# Patient Record
Sex: Male | Born: 1995 | Race: White | Hispanic: No | Marital: Single | Smoking: Former smoker
Health system: Southern US, Community
[De-identification: ages and names within clinical notes are randomized; demographics above are authoritative.]

## PROBLEM LIST (undated history)

## (undated) DIAGNOSIS — R109 Unspecified abdominal pain: Secondary | ICD-10-CM

## (undated) HISTORY — DX: Unspecified abdominal pain: R10.9

---
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
## 2022-07-25 ENCOUNTER — Ambulatory Visit (INDEPENDENT_AMBULATORY_CARE_PROVIDER_SITE_OTHER): Payer: Managed Care, Other (non HMO) | Admitting: Podiatry

## 2022-07-25 ENCOUNTER — Encounter: Payer: Self-pay | Admitting: Podiatry

## 2022-07-25 DIAGNOSIS — S90821A Blister (nonthermal), right foot, initial encounter: Secondary | ICD-10-CM | POA: Diagnosis not present

## 2022-07-25 NOTE — Progress Notes (Signed)
Subjective:  Patient ID: Brett Reynolds, male    DOB: 02-Feb-1996,  MRN: [id number]  Chief Complaint  Patient presents with   Foot Problem    Right foot has a possible cyst at the bottom of the foot, ball of foot, near 5th digit. Painful when walking    27 y.o. male presents with concern for possible cyst on the bottom of the right foot.  He says that this started approximately last week Wednesday or Thursday.  He was working with his dad in the yard they were spreading some mulch.  He was wearing shoes.  Went to urgent care after a hard lump appeared in the bottom of the right foot.  He was told that it could be a ganglion cyst.  He was placed on antibiotics and given pain meds.  Also given a postop shoe to wear on the right foot.  He says that he has significant pain with any pressure on the area.  Denies a history of diabetes.  Has not noticed any drainage from the area.  Past Medical History:  Diagnosis Date   Abdominal pain     Allergies  Allergen Reactions   Triaminic Fever Reducer [Acetaminophen]     ROS: Negative except as per HPI above  Objective:  General: AAO x3, NAD  Dermatological: Attention directed the right plantar lateral forefoot approximately under the fourth metatarsal there is noted to be a large bulla present approximately 1 to 2 cm in diameter.  There is a close skin envelope initially.  Upon debridement of overlying tissue there is noted to be serous fluid that drained out consistent with serous blister.  Underlying tissue is raw and represents an open wound.  No evidence of deep infection or purulence.  No surrounding erythema.  See postdebridement picture below  Vascular:  Dorsalis Pedis artery and Posterior Tibial artery pedal pulses are 2/4 bilateral.  Capillary fill time < 3 sec to all digits.   Neruologic: Grossly intact via light touch bilateral. Protective threshold intact to all sites bilateral.   Musculoskeletal: No gross boney pedal deformities  bilateral. No pain, crepitus, or limitation noted with foot and ankle range of motion bilateral. Muscular strength 5/5 in all groups tested bilateral.  Gait: Unassisted, Nonantalgic.     Radiographs:  Deferred Assessment:   1. Blister of right foot without infection, initial encounter      Plan:  Patient was evaluated and treated and all questions answered.  # Serous blister of right plantar forefoot likely friction blister -Discussed with patient that he developed a friction blister with serous fluid that was drained in the office today. -After Betadine prep a 15 blade was used to lance and then remove the necrotic skin overlying the blister.  Patient tolerated well.  The area was drained of all serous fluid. -Area was then cleaned with Betadine and a nonadherent dressing was applied -Recommend the patient continue with Betadine and nonadherent bandage dressing for the next 1 to 2 weeks until the area is fully healed. -Continue use of postop shoe to prevent pressure on the area. -Decrease activity levels and stay off the foot is much as possible. -No indication for antibiotics given no evidence of infection at the site of the blister.  Return in about 2 weeks (around 08/08/2022).          Corinna Gab, DPM [facility] / [facility]

## 2022-08-15 ENCOUNTER — Encounter: Payer: Self-pay | Admitting: General Practice

## 2022-08-15 ENCOUNTER — Ambulatory Visit (INDEPENDENT_AMBULATORY_CARE_PROVIDER_SITE_OTHER): Payer: Managed Care, Other (non HMO) | Admitting: Podiatry

## 2022-08-15 DIAGNOSIS — S90821D Blister (nonthermal), right foot, subsequent encounter: Secondary | ICD-10-CM | POA: Diagnosis not present

## 2022-08-15 NOTE — Progress Notes (Signed)
  Subjective:  Patient ID: Brett Reynolds, male    DOB: 07/26/1995,  MRN: [id number]  Chief Complaint  Patient presents with   Follow-up    Right blister on foot, bottom. Patient denies any pain. Patient is not longer using the boot.     27 y.o. male presents for follow-up of ruptured blister on the bottom of the right foot.  He says that he is feeling very well there is no pain at the area.  He is no longer putting anything on the prior blister site.  Has healed up very nicely.  He was previously wearing a postop shoe but has stopped using it as the area healed  Past Medical History:  Diagnosis Date   Abdominal pain     Allergies  Allergen Reactions   Triaminic Fever Reducer [Acetaminophen]     ROS: Negative except as per HPI above  Objective:  General: AAO x3, NAD  Dermatological: Attention directed the right plantar lateral forefoot there is noted to be a healed lesion to the right foot with healthy pink epithelium. No open wound. No signs of infection.    Vascular:  Dorsalis Pedis artery and Posterior Tibial artery pedal pulses are 2/4 bilateral.  Capillary fill time < 3 sec to all digits.   Neruologic: Grossly intact via light touch bilateral. Protective threshold intact to all sites bilateral.   Musculoskeletal: No gross boney pedal deformities bilateral. No pain, crepitus, or limitation noted with foot and ankle range of motion bilateral. Muscular strength 5/5 in all groups tested bilateral.  Gait: Unassisted, Nonantalgic.     Radiographs:  Deferred Assessment:   1. Blister of right foot without infection, subsequent encounter       Plan:  Patient was evaluated and treated and all questions answered.  # Serous blister of right plantar forefoot likely friction blister -Discussed that the prior friction blister with serous fluid has now fully healed -No evidence of infection no concern for issues of the area. -Continue to monitor for redness swelling or  drainage from the area.  No follow-ups on file.          Corinna Gab, DPM [facility] / [facility]

## 2022-08-29 ENCOUNTER — Ambulatory Visit (INDEPENDENT_AMBULATORY_CARE_PROVIDER_SITE_OTHER): Payer: Managed Care, Other (non HMO) | Admitting: Podiatry

## 2022-08-29 DIAGNOSIS — Z91199 Patient's noncompliance with other medical treatment and regimen due to unspecified reason: Secondary | ICD-10-CM

## 2022-08-29 NOTE — Progress Notes (Signed)
Pt was a no show for apt, [place]
